# Patient Record
Sex: Female | Born: 1960
Health system: Southern US, Community
[De-identification: ages and names within clinical notes are randomized; demographics above are authoritative.]

---

## 2000-01-15 ENCOUNTER — Other Ambulatory Visit: Admission: RE | Admit: 2000-01-15 | Discharge: 2000-01-15 | Payer: Self-pay | Admitting: Obstetrics and Gynecology

## 2001-05-10 ENCOUNTER — Other Ambulatory Visit: Admission: RE | Admit: 2001-05-10 | Discharge: 2001-05-10 | Payer: Self-pay | Admitting: Obstetrics and Gynecology

## 2001-12-24 ENCOUNTER — Encounter: Admission: RE | Admit: 2001-12-24 | Discharge: 2001-12-24 | Payer: Self-pay | Admitting: Emergency Medicine

## 2001-12-24 ENCOUNTER — Encounter: Payer: Self-pay | Admitting: Emergency Medicine

## 2002-08-10 ENCOUNTER — Other Ambulatory Visit: Admission: RE | Admit: 2002-08-10 | Discharge: 2002-08-10 | Payer: Self-pay | Admitting: Obstetrics and Gynecology

## 2003-10-19 ENCOUNTER — Encounter: Admission: RE | Admit: 2003-10-19 | Discharge: 2003-10-19 | Payer: Self-pay | Admitting: Emergency Medicine

## 2003-10-24 ENCOUNTER — Encounter: Admission: RE | Admit: 2003-10-24 | Discharge: 2003-10-24 | Payer: Self-pay | Admitting: Emergency Medicine

## 2003-11-21 ENCOUNTER — Other Ambulatory Visit: Admission: RE | Admit: 2003-11-21 | Discharge: 2003-11-21 | Payer: Self-pay | Admitting: Obstetrics and Gynecology

## 2003-12-29 ENCOUNTER — Ambulatory Visit (HOSPITAL_COMMUNITY): Admission: RE | Admit: 2003-12-29 | Discharge: 2003-12-29 | Payer: Self-pay | Admitting: Gastroenterology

## 2004-12-27 ENCOUNTER — Other Ambulatory Visit: Admission: RE | Admit: 2004-12-27 | Discharge: 2004-12-27 | Payer: Self-pay | Admitting: Obstetrics and Gynecology

## 2005-01-01 ENCOUNTER — Encounter: Admission: RE | Admit: 2005-01-01 | Discharge: 2005-01-01 | Payer: Self-pay | Admitting: Emergency Medicine

## 2005-05-05 ENCOUNTER — Encounter: Admission: RE | Admit: 2005-05-05 | Discharge: 2005-05-05 | Payer: Self-pay | Admitting: Emergency Medicine

## 2005-07-18 IMAGING — RF DG UGI W/ HIGH DENSITY W/KUB
19 of 24 series · 19 of 24 positions shown · non-contrast
Comparison: none

CLINICAL DATA: Patient has chest pain. 
HIGH DENSITY UPPER GI W/KUB: 
Preliminary KUB reveals the bowel gas pattern to be unremarkable.  Swallowing function is normal.  There is a small sliding type hiatal hernia.  Moderate degree of gastroesophageal reflux is demonstrated particularly with the water siphon test.  Stomach is well filled without abnormality.  There is persistent spasm and some deformity of the duodenal bulb.  On some of the films there is a filling type defect in the base of the bulb which could represent herniated pylorus.    There is some spasmof the  post bulbar duodenum.  Remainder of the duodenum is unremarkable.

[Series 1: run · 1 of 1 slices shown (1 of 19)]
[im 1/1]
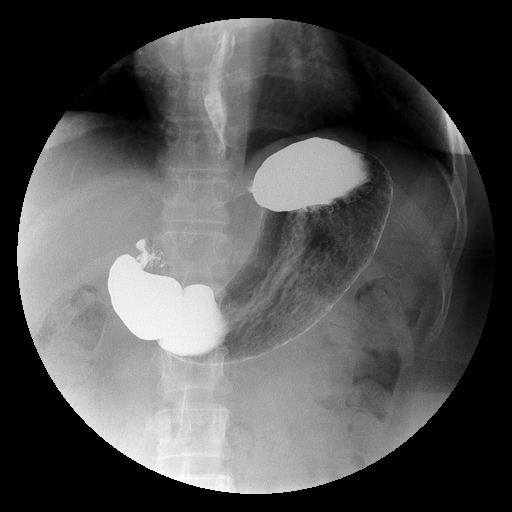

[Series 2: run · 1 of 1 slices shown (2 of 19)]
[im 1/1]
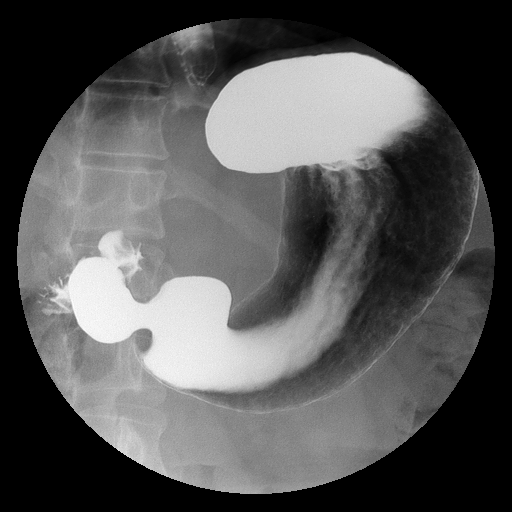

[Series 4: run · 1 of 1 slices shown (3 of 19)]
[im 1/1]
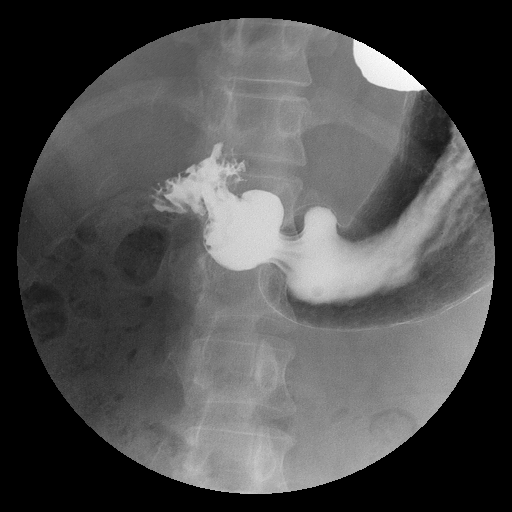

[Series 5: run · 1 of 1 slices shown (4 of 19)]
[im 1/1]
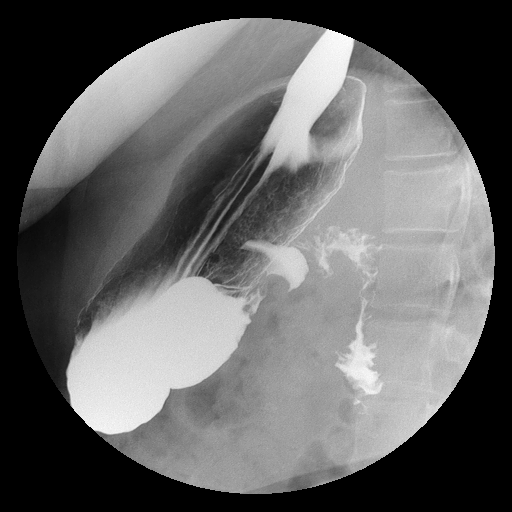

[Series 6: run · 1 of 14 slices shown (5 of 19)]
[im 1/14]
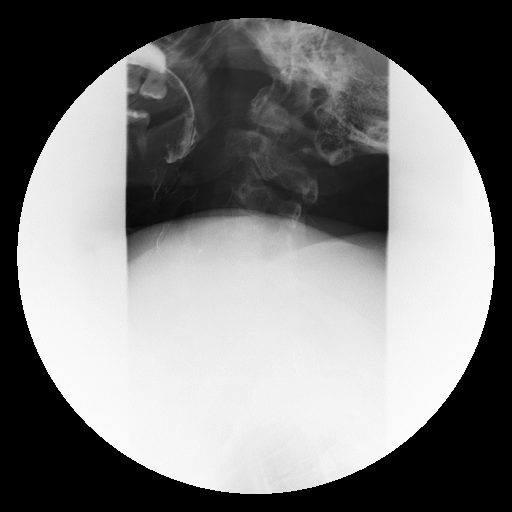

[Series 7: run · 1 of 32 slices shown (6 of 19)]
[im 1/32]
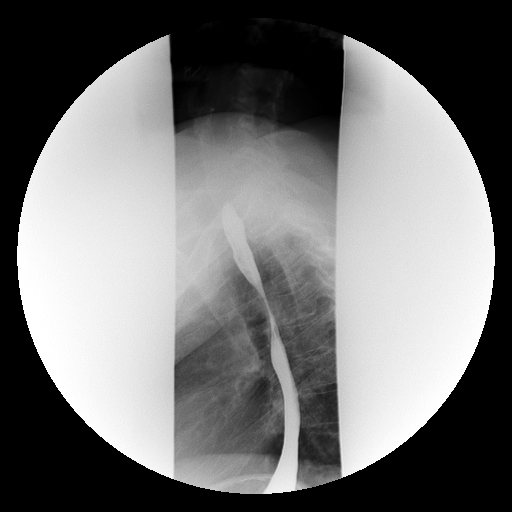

[Series 9: run · 1 of 1 slices shown (7 of 19)]
[im 1/1]
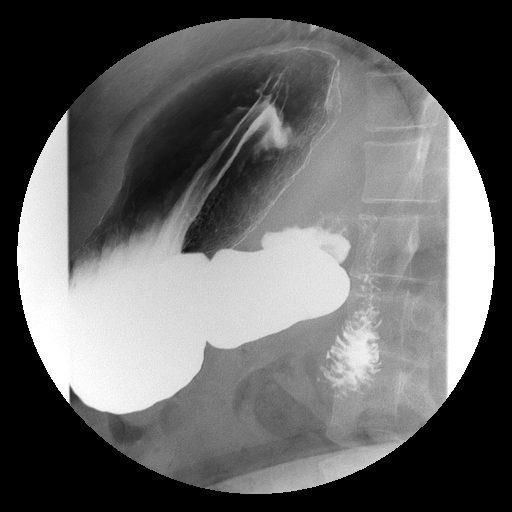

[Series 10: run · 1 of 1 slices shown (8 of 19)]
[im 1/1]
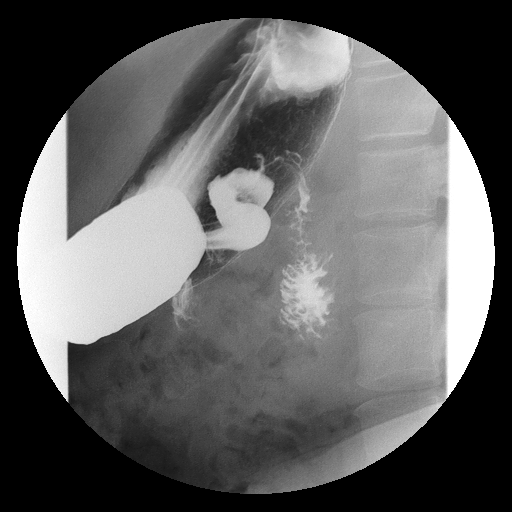

[Series 11: run · 1 of 1 slices shown (9 of 19)]
[im 1/1]
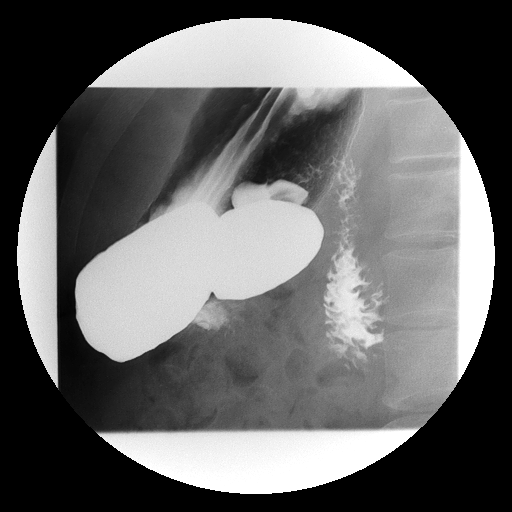

[Series 13: run · 1 of 1 slices shown (10 of 19)]
[im 1/1]
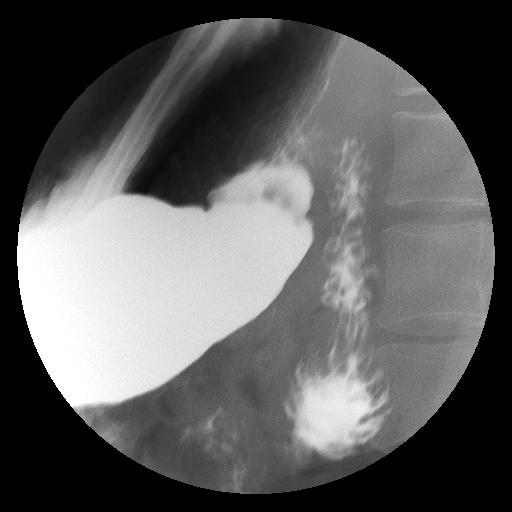

[Series 14: run · 1 of 1 slices shown (11 of 19)]
[im 1/1]
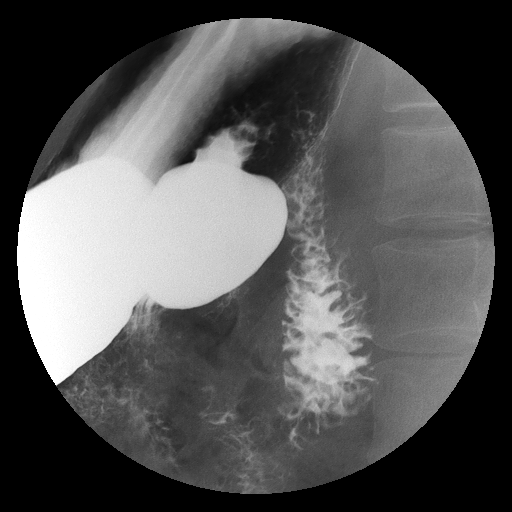

[Series 15: run · 1 of 1 slices shown (12 of 19)]
[im 1/1]
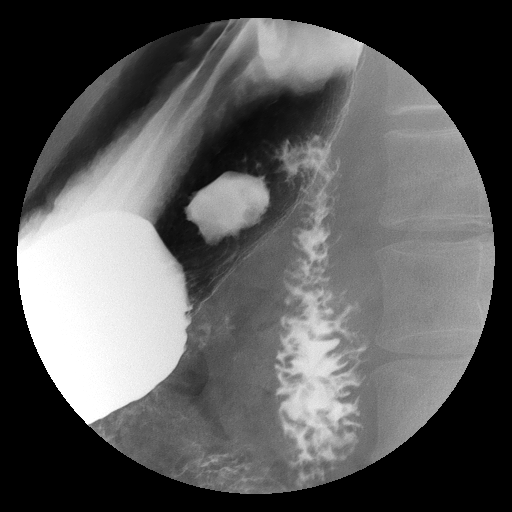

[Series 16: run · 1 of 1 slices shown (13 of 19)]
[im 1/1]
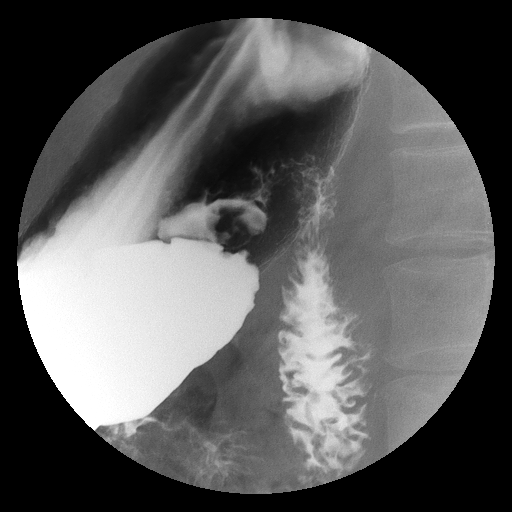

[Series 18: run · 1 of 1 slices shown (14 of 19)]
[im 1/1]
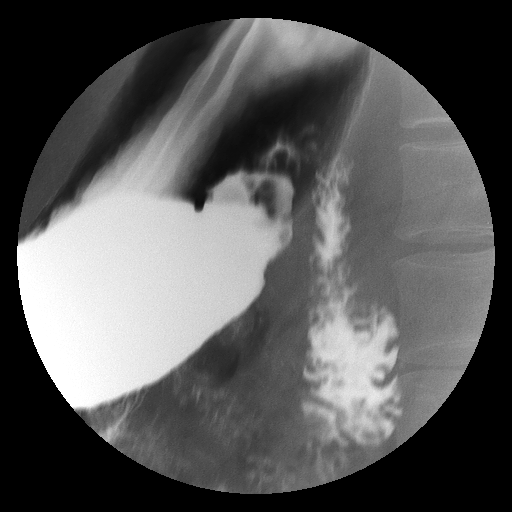

[Series 19: run · 1 of 1 slices shown (15 of 19)]
[im 1/1]
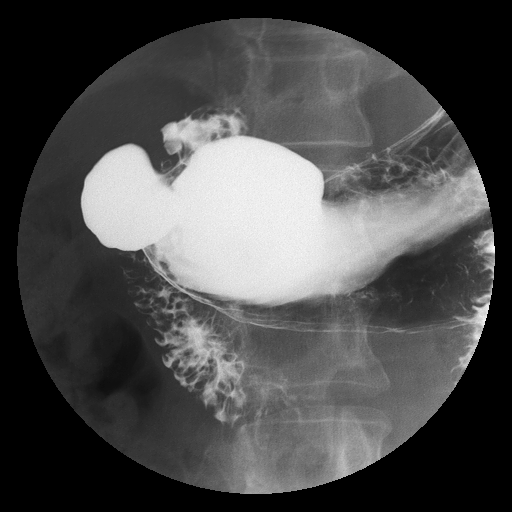

[Series 20: run · 1 of 1 slices shown (16 of 19)]
[im 1/1]
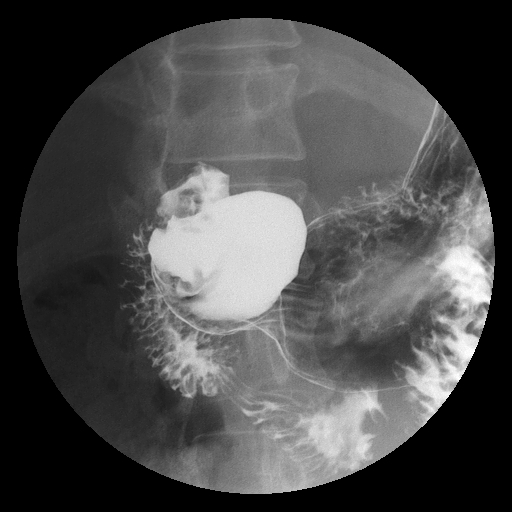

[Series 21: run · 1 of 1 slices shown (17 of 19)]
[im 1/1]
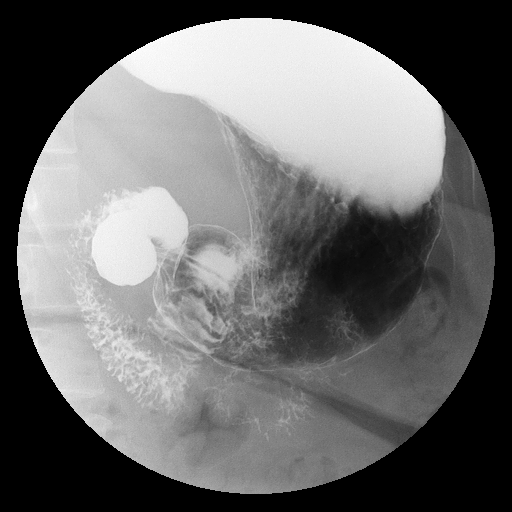

[Series 23: run · 1 of 1 slices shown (18 of 19)]
[im 1/1]
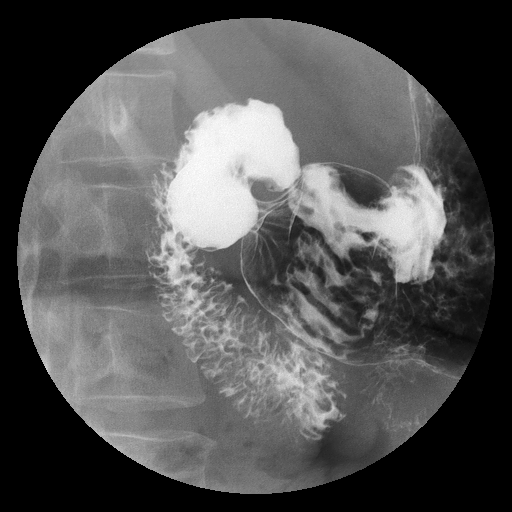

[Series 24: run · 1 of 1 slices shown (19 of 19)]
[im 1/1]
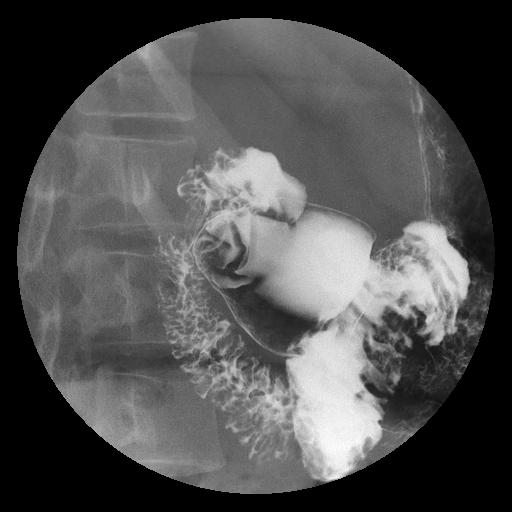

[19 of 24 positions shown; findings below may reference images not displayed]

IMPRESSION: Moderate degree of gastroesophageal reflux.  Duodenal spasm and deformity is present with questionable filling defects in the base of the duodenal bulb.  This could represent herniated pyloric tissue, however  polypoid defects  cannot be excluded of the duodenal bulb.  Endoscopy would be suggested for further evaluation of the duodenal bulb.

## 2006-09-26 IMAGING — CT CT ABDOMEN W/ CM
1 of 2 series · 15 of 32 positions shown, 19 images · IV contrast (GASTROGRAFIN & [ID] OMNI 300)
Comparison: none

CLINICAL DATA: Abdominal pain, particularly the left lower quadrant.  Prior hysterectomy.
 ABDOMEN CT WITH CONTRAST:
TECHNIQUE: Multidetector CT imaging of the abdomen was performed following the standard protocol during bolus administration of intravenous contrast.
 Contrast:  100 cc of Omnipaque 300.
 The lung bases are clear.  A subcentimeter low attenuation structure of the posterior right lobe of liver near the dome is most likely benign in origin, i.e., small cyst or hemangioma.  No ductal dilatation is seen.  No calcified gallstones are noted.  The pancreas is normal in size as are the adrenal glands and spleen.  The kidneys enhance normally and, on delayed images, the pelvocaliceal systems appear normal.  The abdominal aorta is normal in caliber.  No adenopathy is seen.

[Series 2: a&p w/ · axial · 0.66mm/px · z∈[-349,+31]mm · 15 of 84 slices shown, 19 images]
[im 4/84  soft-tissue]
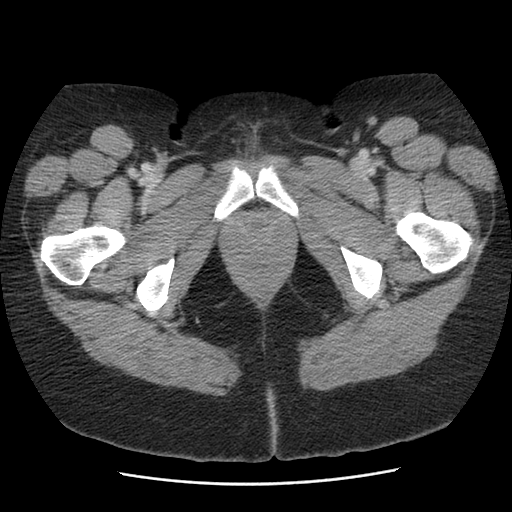
[im 4/84  bone]
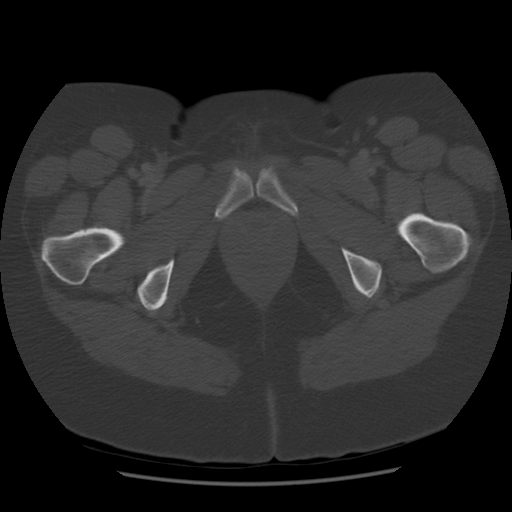
[im 11/84  soft-tissue]
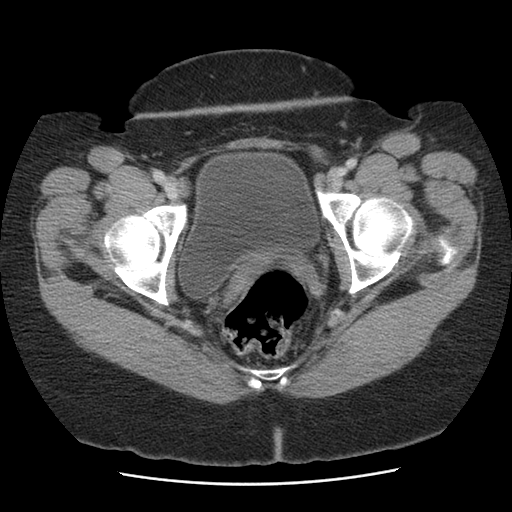
[im 19/84  soft-tissue]
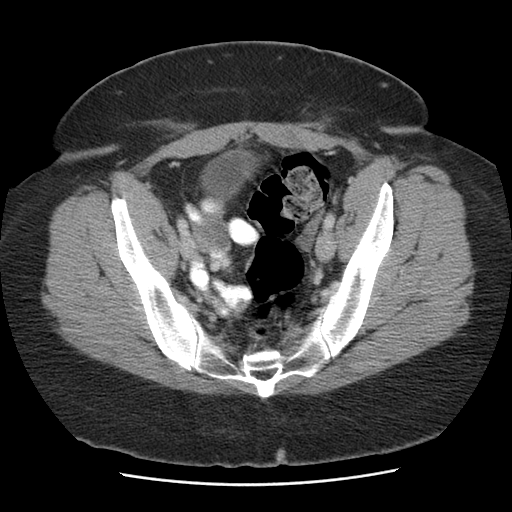
[im 22/84  soft-tissue]
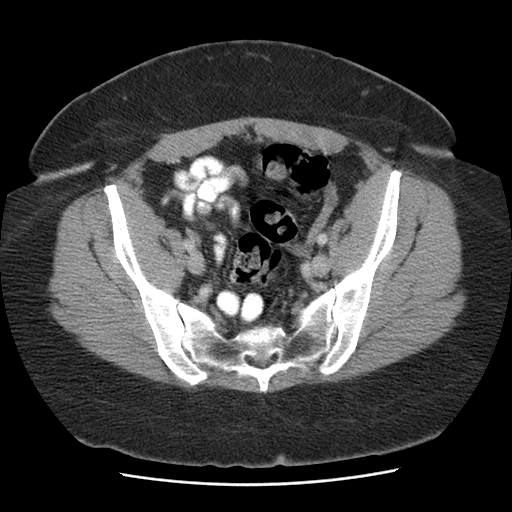
[im 29/84  soft-tissue]
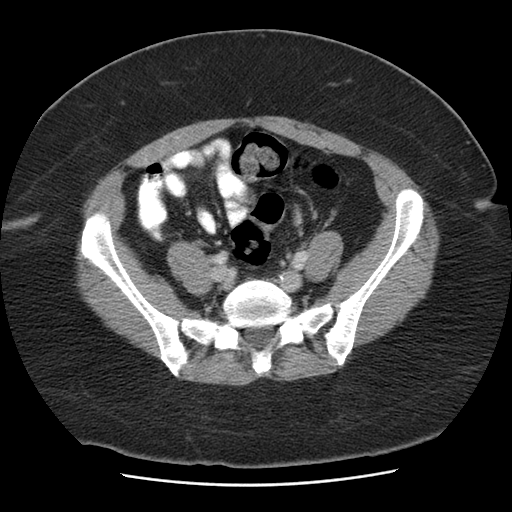
[im 37/84  soft-tissue]
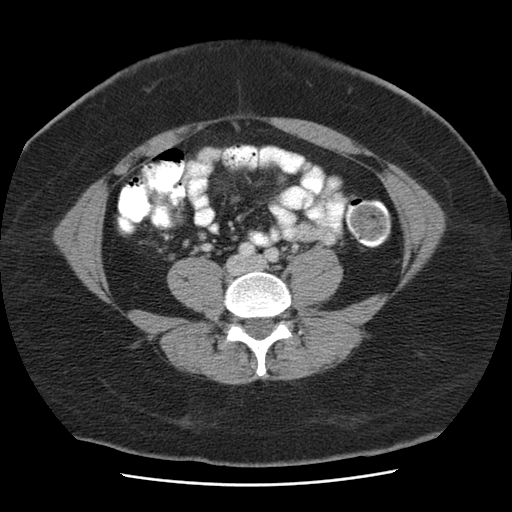
[im 44/84  soft-tissue]
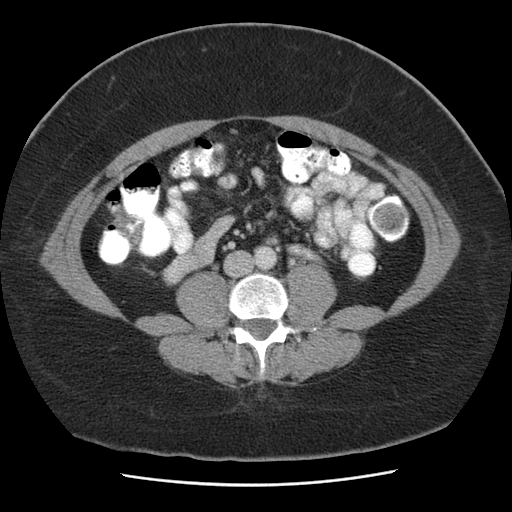
[im 47/84  soft-tissue]
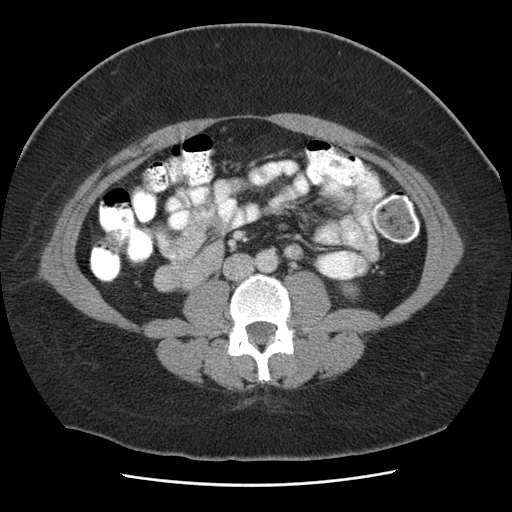
[im 55/84  soft-tissue]
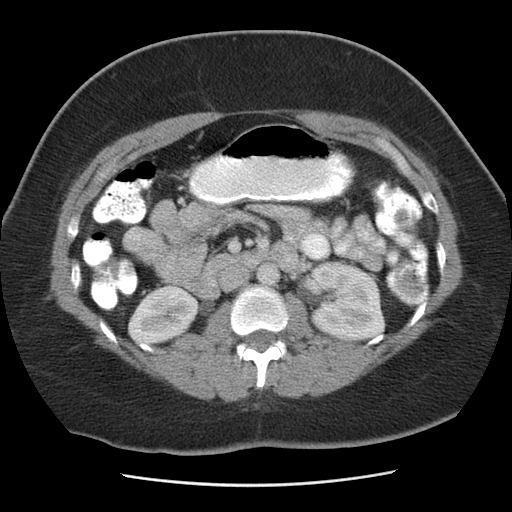
[im 55/84  bone]
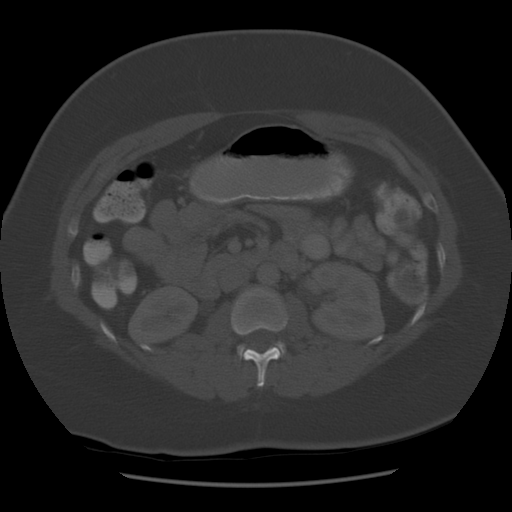
[im 62/84  soft-tissue]
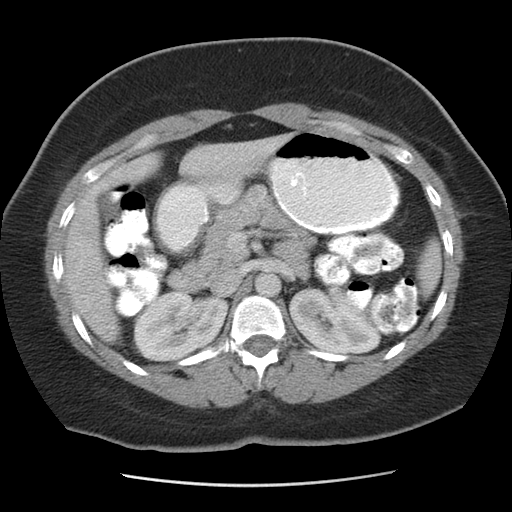
[im 65/84  soft-tissue]
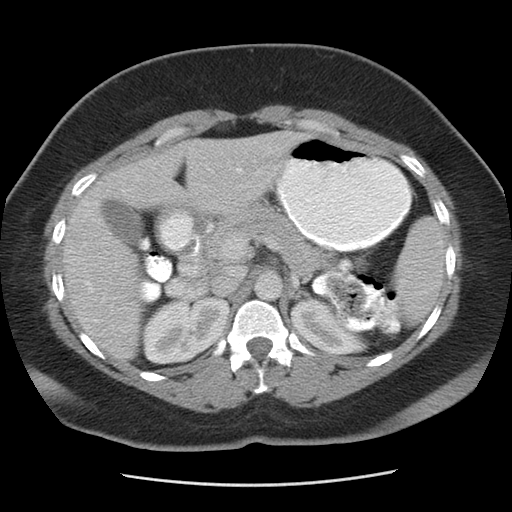
[im 69/84  lung]
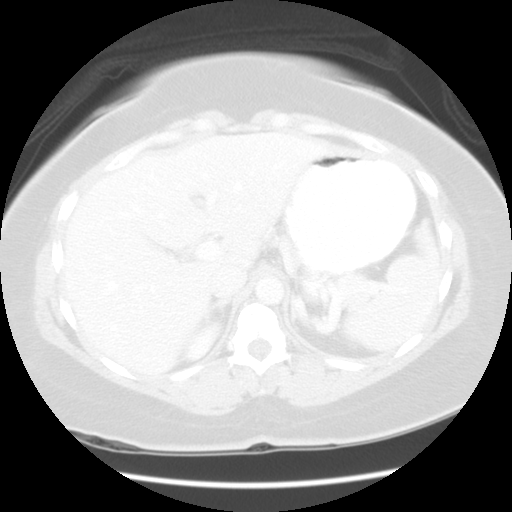
[im 73/84  soft-tissue]
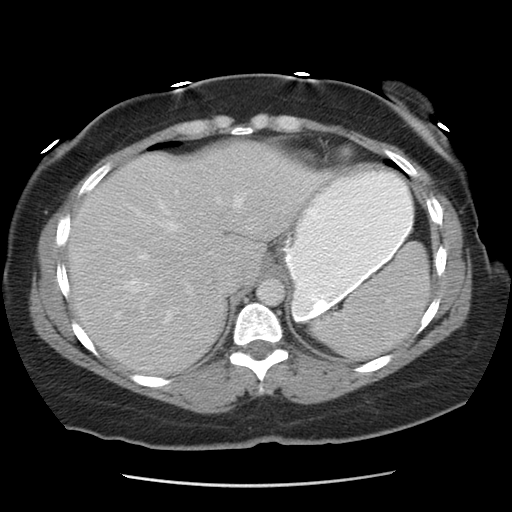
[im 73/84  lung]
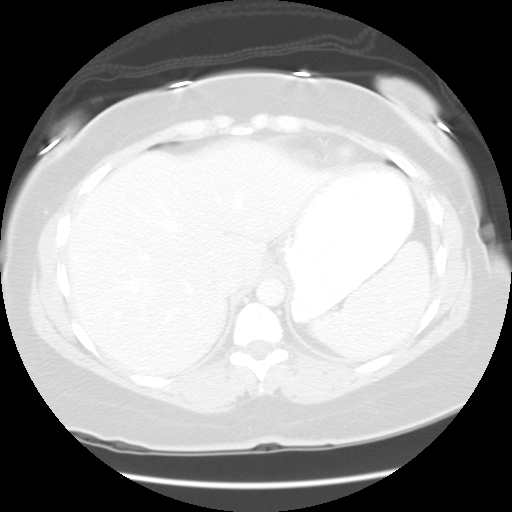
[im 76/84  lung]
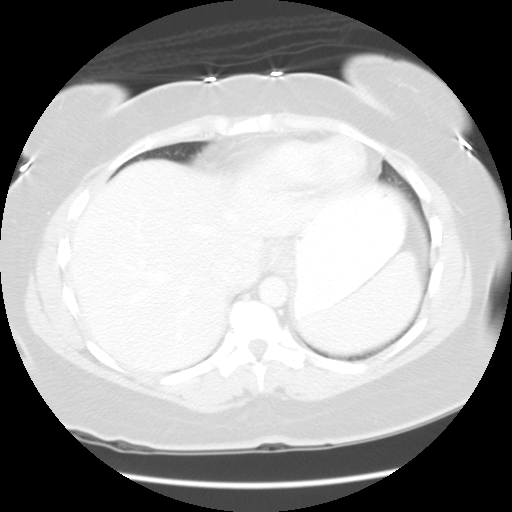
[im 80/84  soft-tissue]
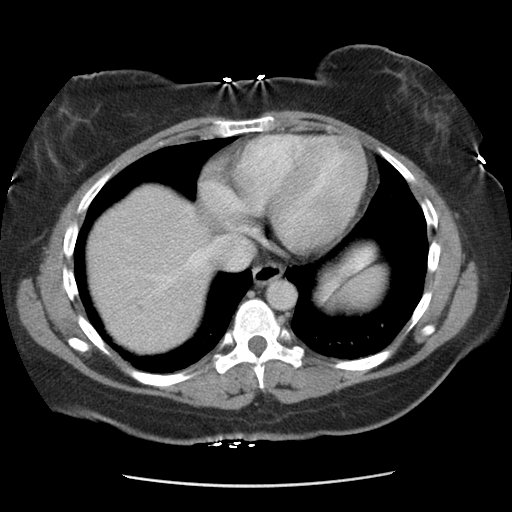
[im 80/84  lung]
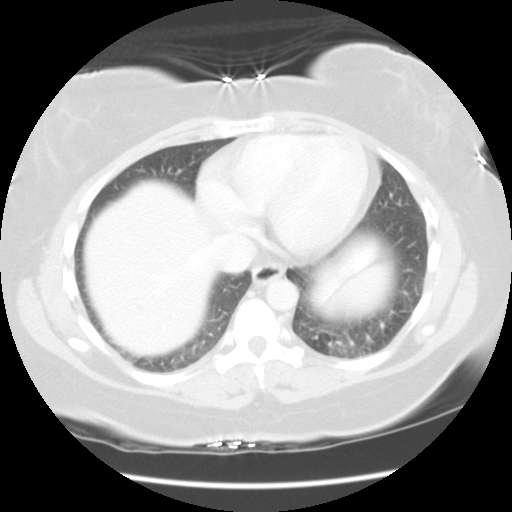

[15 of 32 positions shown; findings below may reference images not displayed]

IMPRESSION: Negative CT of the abdomen.
 CT PELVIS WITH CONTRAST 
 Scans were continued through the pelvis after oral and IV contrast media were given.   There is feces throughout the colon.  Small nodes are present in the right lower quadrant of questionable significance.  The appendix fills with contrast and appears normal.  There is no CT evidence of acute diverticulitis. The urinary bladder is unremarkable.  The patient has previously undergone hysterectomy.  Stress sclerosis is noted involving the SI joints.
IMPRESSION: 1. No acute abnormality on CT of the pelvis.  No diverticulitis is seen.  The appendix appears normal. 
 2. Moderate amount of feces throughout the colon.

## 2009-06-07 ENCOUNTER — Encounter: Admission: RE | Admit: 2009-06-07 | Discharge: 2009-07-25 | Payer: Self-pay | Admitting: Internal Medicine

## 2010-12-13 NOTE — Op Note (Signed)
NAME:  Casey Long, FREUNDLICH                        ACCOUNT NO.:  [id number]   MEDICAL RECORD NO.:  [id number]                   PATIENT TYPE:  AMB   LOCATION:  ENDO                                 FACILITY:  [facility]   PHYSICIAN:  Anselmo Rod, M.D.               DATE OF BIRTH:  1960/12/05   DATE OF PROCEDURE:  12/29/2003  DATE OF DISCHARGE:                                 OPERATIVE REPORT   PROCEDURE:  Esophagogastroduodenoscopy.   ENDOSCOPIST:  Anselmo Rod, M.D.   INSTRUMENT USED:  Olympus video panendoscope.   INDICATION FOR PROCEDURE:  A deformity seen in the duodenal bulb in a 50-  year-old African-American female who had an upper GI series for chest pain.  Rule out hernia to pyloric tissue versus polypoid defect.   PREPROCEDURE PREPARATION:  Informed consent was procured from the patient.  The patient was fasted for eight hours prior to the procedure.   PREPARATION:  VITAL SIGNS:  The patient had stable vital signs.  NECK:  Supple.  CHEST:  Clear to auscultation.  S1, S2 regular.  ABDOMEN:  Soft with normal bowel sounds.   DESCRIPTION OF PROCEDURE:  The patient was placed in the left lateral  decubitus position and sedated with 75 mg of Demerol and 6 mg of Versed in  slow incremental doses.  Once the patient was adequately sedate and  maintained on low-flow oxygen and continuous cardiac monitoring, the Olympus  video panendoscope was advanced through the mouthpiece, over the tongue,  into the esophagus under direct vision.  The entire esophagus appeared  normal with no evidence of ring, stricture, masses, esophagitis, or  Barrett's mucosa.  The scope was then advanced into the stomach.  A small  hiatal hernia was seen on high retroflexion.  An active reflux was noted  into the distal esophagus from the stomach.  The entire gastric mucosa and  the proximal small bowel appeared normal.   IMPRESSION:  Normal EGD except for a hiatal hernia and active reflux.   RECOMMENDATIONS:  1. Continue Aciphex as before.  2. Avoid nonsteroidals, including aspirin, for now.  3. Follow antireflux measures.  4. Outpatient follow-up as need arises in the future.  If abdominal pain     recurs, an abdominal ultrasound and HIDA scan will be considered.  No     abnormality was noted in the duodenal bulb as described on he upper GI     series.                                               Anselmo Rod, M.D.    JNM/MEDQ  D:  12/29/2003  T:  12/30/2003  Job:  [id number]   cc:   Reuben Likes, M.D.  [address]  [employer]  [place] [address]  Fax: [phone]

## 2016-11-19 ENCOUNTER — Other Ambulatory Visit (HOSPITAL_COMMUNITY): Payer: Self-pay | Admitting: Orthopedic Surgery

## 2016-11-19 DIAGNOSIS — M79605 Pain in left leg: Secondary | ICD-10-CM

## 2016-11-19 DIAGNOSIS — M7989 Other specified soft tissue disorders: Principal | ICD-10-CM

## 2016-11-21 ENCOUNTER — Other Ambulatory Visit (HOSPITAL_COMMUNITY): Payer: Self-pay | Admitting: Orthopedic Surgery

## 2016-11-21 ENCOUNTER — Ambulatory Visit (HOSPITAL_COMMUNITY)
Admission: RE | Admit: 2016-11-21 | Discharge: 2016-11-21 | Disposition: A | Discharge status: Home / Self Care | Payer: 59 | Source: Ambulatory Visit | Attending: Orthopedic Surgery | Admitting: Orthopedic Surgery

## 2016-11-21 DIAGNOSIS — M79604 Pain in right leg: Secondary | ICD-10-CM | POA: Diagnosis not present

## 2016-11-21 DIAGNOSIS — M79605 Pain in left leg: Secondary | ICD-10-CM

## 2016-11-21 DIAGNOSIS — M7989 Other specified soft tissue disorders: Principal | ICD-10-CM

## 2016-11-21 NOTE — Progress Notes (Signed)
VASCULAR LAB PRELIMINARY  PRELIMINARY  PRELIMINARY  PRELIMINARY  Right lower extremity venous duplex completed.    Preliminary report:  Right:  No evidence of DVT, superficial thrombosis, or Baker's cyst.  Maribel Hadley, RVS 11/21/2016, 4:05 PM

## 2017-07-31 DIAGNOSIS — M6281 Muscle weakness (generalized): Secondary | ICD-10-CM | POA: Diagnosis not present

## 2017-07-31 DIAGNOSIS — M545 Low back pain: Secondary | ICD-10-CM | POA: Diagnosis not present

## 2017-07-31 DIAGNOSIS — M5416 Radiculopathy, lumbar region: Secondary | ICD-10-CM | POA: Diagnosis not present

## 2017-08-07 DIAGNOSIS — M6281 Muscle weakness (generalized): Secondary | ICD-10-CM | POA: Diagnosis not present

## 2017-08-07 DIAGNOSIS — M545 Low back pain: Secondary | ICD-10-CM | POA: Diagnosis not present

## 2017-08-07 DIAGNOSIS — M5416 Radiculopathy, lumbar region: Secondary | ICD-10-CM | POA: Diagnosis not present

## 2017-08-14 DIAGNOSIS — M6281 Muscle weakness (generalized): Secondary | ICD-10-CM | POA: Diagnosis not present

## 2017-08-14 DIAGNOSIS — M5416 Radiculopathy, lumbar region: Secondary | ICD-10-CM | POA: Diagnosis not present

## 2017-08-14 DIAGNOSIS — M545 Low back pain: Secondary | ICD-10-CM | POA: Diagnosis not present

## 2017-08-21 DIAGNOSIS — M6281 Muscle weakness (generalized): Secondary | ICD-10-CM | POA: Diagnosis not present

## 2017-08-21 DIAGNOSIS — M545 Low back pain: Secondary | ICD-10-CM | POA: Diagnosis not present

## 2017-08-21 DIAGNOSIS — M5416 Radiculopathy, lumbar region: Secondary | ICD-10-CM | POA: Diagnosis not present

## 2017-09-02 DIAGNOSIS — M6281 Muscle weakness (generalized): Secondary | ICD-10-CM | POA: Diagnosis not present

## 2017-09-02 DIAGNOSIS — M5416 Radiculopathy, lumbar region: Secondary | ICD-10-CM | POA: Diagnosis not present

## 2017-09-02 DIAGNOSIS — M545 Low back pain: Secondary | ICD-10-CM | POA: Diagnosis not present

## 2018-01-21 DIAGNOSIS — Z01419 Encounter for gynecological examination (general) (routine) without abnormal findings: Secondary | ICD-10-CM | POA: Diagnosis not present

## 2018-01-21 DIAGNOSIS — Z6841 Body Mass Index (BMI) 40.0 and over, adult: Secondary | ICD-10-CM | POA: Diagnosis not present

## 2018-04-29 DIAGNOSIS — I10 Essential (primary) hypertension: Secondary | ICD-10-CM | POA: Diagnosis not present

## 2018-05-03 DIAGNOSIS — Z0000 Encounter for general adult medical examination without abnormal findings: Secondary | ICD-10-CM | POA: Diagnosis not present

## 2018-05-03 DIAGNOSIS — I10 Essential (primary) hypertension: Secondary | ICD-10-CM | POA: Diagnosis not present

## 2018-05-03 DIAGNOSIS — E663 Overweight: Secondary | ICD-10-CM | POA: Diagnosis not present

## 2018-05-21 DIAGNOSIS — B354 Tinea corporis: Secondary | ICD-10-CM | POA: Diagnosis not present

## 2018-05-26 DIAGNOSIS — S83412A Sprain of medial collateral ligament of left knee, initial encounter: Secondary | ICD-10-CM | POA: Diagnosis not present

## 2018-06-09 DIAGNOSIS — S83412D Sprain of medial collateral ligament of left knee, subsequent encounter: Secondary | ICD-10-CM | POA: Diagnosis not present

## 2018-07-23 DIAGNOSIS — Z1231 Encounter for screening mammogram for malignant neoplasm of breast: Secondary | ICD-10-CM | POA: Diagnosis not present

## 2018-11-03 DIAGNOSIS — E663 Overweight: Secondary | ICD-10-CM | POA: Diagnosis not present

## 2018-11-03 DIAGNOSIS — I10 Essential (primary) hypertension: Secondary | ICD-10-CM | POA: Diagnosis not present

## 2018-11-03 DIAGNOSIS — M5442 Lumbago with sciatica, left side: Secondary | ICD-10-CM | POA: Diagnosis not present

## 2018-11-24 DIAGNOSIS — H01005 Unspecified blepharitis left lower eyelid: Secondary | ICD-10-CM | POA: Diagnosis not present

## 2020-06-19 ENCOUNTER — Other Ambulatory Visit: Payer: Self-pay | Admitting: Oncology

## 2020-06-19 DIAGNOSIS — U071 COVID-19: Secondary | ICD-10-CM

## 2020-06-19 NOTE — Progress Notes (Signed)
I connected by phone with  Casey Long to discuss the potential use of an new treatment for mild to moderate COVID-19 viral infection in non-hospitalized patients.   This patient is a age/sex that meets the FDA criteria for Emergency Use Authorization of casirivimab\imdevimab.  Has a (+) direct SARS-CoV-2 viral test result 1. Has mild or moderate COVID-19  2. Is ? 57 years of age and weighs ? 40 kg 3. Is NOT hospitalized due to COVID-19 4. Is NOT requiring oxygen therapy or requiring an increase in baseline oxygen flow rate due to COVID-19 5. Is within 10 days of symptom onset 6. Has at least one of the high risk factor(s) for progression to severe COVID-19 and/or hospitalization as defined in EUA. ? Specific high risk criteria :No past medical history on file. ? HTN   Symptom onset  06/14/2020   I have spoken and communicated the following to the patient or parent/caregiver:   1. FDA has authorized the emergency use of casirivimab\imdevimab for the treatment of mild to moderate COVID-19 in adults and pediatric patients with positive results of direct SARS-CoV-2 viral testing who are 22 years of age and older weighing at least 40 kg, and who are at high risk for progressing to severe COVID-19 and/or hospitalization.   2. The significant known and potential risks and benefits of casirivimab\imdevimab, and the extent to which such potential risks and benefits are unknown.   3. Information on available alternative treatments and the risks and benefits of those alternatives, including clinical trials.   4. Patients treated with casirivimab\imdevimab should continue to self-isolate and use infection control measures (e.g., wear mask, isolate, social distance, avoid sharing personal items, clean and disinfect "high touch" surfaces, and frequent handwashing) according to CDC guidelines.    5. The patient or parent/caregiver has the option to accept or refuse casirivimab\imdevimab .   After  reviewing this information with the patient, The patient agreed to proceed with receiving casirivimab\imdevimab infusion and will be provided a copy of the Fact sheet prior to receiving the infusion.Mignon Pine, AGNP-C [phone] (Infusion Center Hotline)

## 2020-06-20 ENCOUNTER — Ambulatory Visit (HOSPITAL_COMMUNITY)
Admission: RE | Admit: 2020-06-20 | Discharge: 2020-06-20 | Disposition: A | Discharge status: Home / Self Care | Payer: 59 | Source: Ambulatory Visit | Attending: Pulmonary Disease | Admitting: Pulmonary Disease

## 2020-06-20 DIAGNOSIS — U071 COVID-19: Secondary | ICD-10-CM | POA: Diagnosis not present

## 2020-06-20 MED ORDER — SOTROVIMAB 500 MG/8ML IV SOLN
500.0000 mg | Freq: Once | INTRAVENOUS | Status: AC
  Administered 2020-06-20: 500 mg via INTRAVENOUS

## 2020-06-20 MED ORDER — DIPHENHYDRAMINE HCL 50 MG/ML IJ SOLN: 50.0000 mg | Freq: Once | INTRAMUSCULAR | Status: DC | PRN

## 2020-06-20 MED ORDER — ALBUTEROL SULFATE HFA 108 (90 BASE) MCG/ACT IN AERS: 2.0000 | INHALATION_SPRAY | Freq: Once | RESPIRATORY_TRACT | Status: DC | PRN

## 2020-06-20 MED ORDER — METHYLPREDNISOLONE SODIUM SUCC 125 MG IJ SOLR: 125.0000 mg | Freq: Once | INTRAMUSCULAR | Status: DC | PRN

## 2020-06-20 MED ORDER — SODIUM CHLORIDE 0.9 % IV SOLN: INTRAVENOUS | Status: DC | PRN

## 2020-06-20 MED ORDER — EPINEPHRINE 0.3 MG/0.3ML IJ SOAJ: 0.3000 mg | Freq: Once | INTRAMUSCULAR | Status: DC | PRN

## 2020-06-20 MED ORDER — FAMOTIDINE IN NACL 20-0.9 MG/50ML-% IV SOLN: 20.0000 mg | Freq: Once | INTRAVENOUS | Status: DC | PRN

## 2020-06-20 NOTE — Progress Notes (Signed)
Diagnosis: COVID-19  Physician: Dr. Patrick Wright  Procedure: [facility] Med: Sotrovimab infusion - Provided patient with sotrovimab fact sheet for patients, parents, and caregivers prior to infusion.   Complications: No immediate complications noted  Discharge: Discharged home  If after the infusion you have any questions or concerns please call the Advanced Practice Provider at [phone] 

## 2020-06-20 NOTE — Discharge Instructions (Signed)

## 2020-06-20 NOTE — Progress Notes (Signed)
Patient reviewed Fact Sheet for Patients, Parents, and Caregivers for Emergency Use Authorization (EUA) of Sotrovimab for the Treatment of Coronavirus. Patient also reviewed and is agreeable to the estimated cost of treatment. Patient is agreeable to proceed.
